# Patient Record
Sex: Female | Born: 1935 | Race: Black or African American | Hispanic: No | Marital: Married | Smoking: Former smoker
Health system: Southern US, Community
[De-identification: ages and names within clinical notes are randomized; demographics above are authoritative.]

## PROBLEM LIST (undated history)

## (undated) DIAGNOSIS — E785 Hyperlipidemia, unspecified: Secondary | ICD-10-CM

## (undated) DIAGNOSIS — I10 Essential (primary) hypertension: Secondary | ICD-10-CM

## (undated) DIAGNOSIS — H35359 Cystoid macular degeneration, unspecified eye: Secondary | ICD-10-CM

## (undated) DIAGNOSIS — I4891 Unspecified atrial fibrillation: Secondary | ICD-10-CM

---

## 2004-08-22 ENCOUNTER — Ambulatory Visit: Payer: Self-pay

## 2006-01-29 ENCOUNTER — Ambulatory Visit: Payer: Self-pay | Admitting: Internal Medicine

## 2006-04-23 ENCOUNTER — Ambulatory Visit: Payer: Self-pay | Admitting: Gastroenterology

## 2007-02-07 ENCOUNTER — Emergency Department: Payer: Self-pay | Admitting: Emergency Medicine

## 2007-02-11 ENCOUNTER — Ambulatory Visit: Payer: Self-pay | Admitting: Internal Medicine

## 2007-03-10 ENCOUNTER — Ambulatory Visit: Payer: Self-pay | Admitting: Internal Medicine

## 2007-12-16 ENCOUNTER — Ambulatory Visit: Payer: Self-pay | Admitting: Internal Medicine

## 2008-01-23 ENCOUNTER — Ambulatory Visit: Payer: Self-pay | Admitting: Family Medicine

## 2009-02-23 ENCOUNTER — Emergency Department: Payer: Self-pay | Admitting: Emergency Medicine

## 2009-04-12 ENCOUNTER — Ambulatory Visit: Payer: Self-pay | Admitting: Internal Medicine

## 2009-05-08 ENCOUNTER — Ambulatory Visit: Payer: Self-pay | Admitting: Gastroenterology

## 2009-06-07 ENCOUNTER — Ambulatory Visit: Payer: Self-pay | Admitting: Surgery

## 2009-06-14 ENCOUNTER — Inpatient Hospital Stay: Payer: Self-pay | Admitting: Surgery

## 2009-06-22 ENCOUNTER — Inpatient Hospital Stay: Payer: Self-pay | Admitting: Internal Medicine

## 2013-01-17 ENCOUNTER — Ambulatory Visit: Payer: Self-pay | Admitting: Emergency Medicine

## 2013-01-17 LAB — COMPREHENSIVE METABOLIC PANEL
Albumin: 3.3 g/dL — ABNORMAL LOW (ref 3.4–5.0)
Alkaline Phosphatase: 150 U/L — ABNORMAL HIGH (ref 50–136)
Anion Gap: 11 (ref 7–16)
BUN: 16 mg/dL (ref 7–18)
Bilirubin,Total: 0.6 mg/dL (ref 0.2–1.0)
Calcium, Total: 9.7 mg/dL (ref 8.5–10.1)
Chloride: 101 mmol/L (ref 98–107)
Co2: 25 mmol/L (ref 21–32)
Creatinine: 1.32 mg/dL — ABNORMAL HIGH (ref 0.60–1.30)
EGFR (African American): 45 — ABNORMAL LOW
EGFR (Non-African Amer.): 39 — ABNORMAL LOW
Glucose: 111 mg/dL — ABNORMAL HIGH (ref 65–99)
Osmolality: 276 (ref 275–301)
Potassium: 3.7 mmol/L (ref 3.5–5.1)
SGOT(AST): 17 U/L (ref 15–37)
SGPT (ALT): 21 U/L (ref 12–78)
Sodium: 137 mmol/L (ref 136–145)
Total Protein: 8.3 g/dL — ABNORMAL HIGH (ref 6.4–8.2)

## 2013-01-17 LAB — CBC WITH DIFFERENTIAL/PLATELET
Basophil #: 0.1 10*3/uL (ref 0.0–0.1)
Basophil %: 0.4 %
Eosinophil #: 0 10*3/uL (ref 0.0–0.7)
Eosinophil %: 0.2 %
HCT: 38.2 % (ref 35.0–47.0)
HGB: 11.9 g/dL — ABNORMAL LOW (ref 12.0–16.0)
Lymphocyte #: 1.4 10*3/uL (ref 1.0–3.6)
Lymphocyte %: 9.8 %
MCH: 25.1 pg — ABNORMAL LOW (ref 26.0–34.0)
MCHC: 31.1 g/dL — ABNORMAL LOW (ref 32.0–36.0)
MCV: 81 fL (ref 80–100)
Monocyte #: 1 x10 3/mm — ABNORMAL HIGH (ref 0.2–0.9)
Monocyte %: 6.5 %
Neutrophil #: 12.2 10*3/uL — ABNORMAL HIGH (ref 1.4–6.5)
Neutrophil %: 83.1 %
Platelet: 287 10*3/uL (ref 150–440)
RBC: 4.73 10*6/uL (ref 3.80–5.20)
RDW: 14.3 % (ref 11.5–14.5)
WBC: 14.7 10*3/uL — ABNORMAL HIGH (ref 3.6–11.0)

## 2014-07-04 ENCOUNTER — Ambulatory Visit: Payer: Self-pay | Admitting: Family Medicine

## 2014-07-04 LAB — RAPID STREP-A WITH REFLX: MICRO TEXT REPORT: NEGATIVE

## 2014-07-07 LAB — BETA STREP CULTURE(ARMC)

## 2014-09-09 ENCOUNTER — Ambulatory Visit: Payer: Self-pay | Admitting: Registered Nurse

## 2017-09-26 ENCOUNTER — Other Ambulatory Visit: Payer: Self-pay

## 2017-09-26 ENCOUNTER — Ambulatory Visit
Admission: EM | Admit: 2017-09-26 | Discharge: 2017-09-26 | Disposition: A | Discharge status: Home / Self Care | Payer: Medicare Other | Attending: Family Medicine | Admitting: Family Medicine

## 2017-09-26 DIAGNOSIS — N3000 Acute cystitis without hematuria: Secondary | ICD-10-CM | POA: Diagnosis not present

## 2017-09-26 HISTORY — DX: Unspecified atrial fibrillation: I48.91

## 2017-09-26 HISTORY — DX: Cystoid macular degeneration, unspecified eye: H35.359

## 2017-09-26 HISTORY — DX: Essential (primary) hypertension: I10

## 2017-09-26 HISTORY — DX: Hyperlipidemia, unspecified: E78.5

## 2017-09-26 LAB — URINALYSIS, COMPLETE (UACMP) WITH MICROSCOPIC
BILIRUBIN URINE: NEGATIVE
GLUCOSE, UA: NEGATIVE mg/dL
HGB URINE DIPSTICK: NEGATIVE
Leukocytes, UA: NEGATIVE
NITRITE: NEGATIVE
Protein, ur: 30 mg/dL — AB
SPECIFIC GRAVITY, URINE: 1.025 (ref 1.005–1.030)
pH: 5 (ref 5.0–8.0)

## 2017-09-26 MED ORDER — FOSFOMYCIN TROMETHAMINE 3 G PO PACK: 3.0000 g | PACK | Freq: Once | ORAL | 0 refills | Status: AC

## 2017-09-26 NOTE — ED Triage Notes (Signed)
Pt reports dysuria, abd discomfort, urinary frequency, and incontinence starting Thursday night.

## 2017-09-26 NOTE — ED Provider Notes (Signed)
[facility]    CSN: [id number] Arrival date & time: 09/26/17  1444     History   Chief Complaint Chief Complaint  Patient presents with  . Urinary Frequency    HPI Willa FraterBetty F Larkey is a 83 y.o. female.   The history is provided by the patient.  Dysuria  Pain quality:  Burning Pain severity:  Mild Onset quality:  Sudden Duration:  2 days Timing:  Constant Progression:  Worsening Chronicity:  New Recent urinary tract infections: no   Relieved by:  None tried Urinary symptoms: discolored urine and frequent urination   Associated symptoms: nausea   Associated symptoms: no abdominal pain, no fever, no flank pain, no genital lesions, no vaginal discharge and no vomiting   Risk factors: no hx of pyelonephritis, no hx of urolithiasis, no kidney transplant, not pregnant, no recurrent urinary tract infections and no renal cysts     Past Medical History:  Diagnosis Date  . Atrial fibrillation (HCC)   . Cystoid macular edema   . Hyperlipidemia   . Hypertension     There are no active problems to display for this patient.   History reviewed. No pertinent surgical history.  OB History    No data available       Home Medications    Prior to Admission medications   Medication Sig Start Date End Date Taking? Authorizing Provider  aspirin EC 81 MG tablet Take 81 mg by mouth daily.   Yes [provider]  budesonide-formoterol (SYMBICORT) 160-4.5 MCG/ACT inhaler Inhale 2 puffs into the lungs 2 (two) times daily.   Yes [provider]  furosemide (LASIX) 10 MG/ML solution Take by mouth daily.   Yes [provider]  ipratropium-albuterol (DUONEB) 0.5-2.5 (3) MG/3ML SOLN Take 3 mLs by nebulization.   Yes [provider]  loratadine (CLARITIN) 10 MG tablet Take 10 mg by mouth daily.   Yes [provider]  metoprolol succinate (TOPROL-XL) 50 MG 24 hr tablet Take 50 mg by mouth daily. Take with or immediately following a  meal.   Yes [provider]  metoprolol tartrate (LOPRESSOR) 25 MG tablet Take 25 mg by mouth 2 (two) times daily.   Yes [provider]  mirtazapine (REMERON) 15 MG tablet Take 15 mg by mouth at bedtime.   Yes [provider]  omeprazole (PRILOSEC) 20 MG capsule Take 20 mg by mouth daily.   Yes [provider]  pravastatin (PRAVACHOL) 20 MG tablet Take 20 mg by mouth daily.   Yes [provider]    Family History History reviewed. No pertinent family history.  Social History Social History   Tobacco Use  . Smoking status: Former Smoker    Types: Cigarettes  . Smokeless tobacco: Never Used  Substance Use Topics  . Alcohol use: No    Frequency: Never  . Drug use: No     Allergies   Morphine and related and Penicillins   Review of Systems Review of Systems  Constitutional: Negative for fever.  Gastrointestinal: Positive for nausea. Negative for abdominal pain and vomiting.  Genitourinary: Positive for dysuria. Negative for flank pain and vaginal discharge.     Physical Exam Triage Vital Signs ED Triage Vitals  Enc Vitals Group     BP 09/26/17 1627 (!) 174/95     Pulse Rate 09/26/17 1627 87     Resp 09/26/17 1627 (!) 22     Temp 09/26/17 1627 97.9 F (36.6 C)  Temp Source 09/26/17 1627 Oral     SpO2 09/26/17 1627 96 %     Weight 09/26/17 1625 70 lb (31.8 kg)     Height 09/26/17 1625 4\' 11"  (1.499 m)     Head Circumference --      Peak Flow --      Pain Score 09/26/17 1624 3     Pain Loc --      Pain Edu? --      Excl. in GC? --    No data found.  Updated Vital Signs BP (!) 174/95 (BP Location: Left Arm)   Pulse 87   Temp 97.9 F (36.6 C) (Oral)   Resp (!) 22   Ht 4\' 11"  (1.499 m)   Wt 70 lb (31.8 kg)   SpO2 96%   BMI 14.14 kg/m   Visual Acuity Right Eye Distance:   Left Eye Distance:   Bilateral Distance:    Right Eye Near:   Left Eye Near:    Bilateral Near:     Physical Exam  Constitutional:  She appears well-developed and well-nourished. No distress.  Abdominal: Soft. Bowel sounds are normal. She exhibits no distension and no mass. There is tenderness (mild, suprapubic). There is no rebound and no guarding.  Skin: She is not diaphoretic.  Nursing note and vitals reviewed.    UC Treatments / Results  Labs (all labs ordered are listed, but only abnormal results are displayed) Labs Reviewed  URINALYSIS, COMPLETE (UACMP) WITH MICROSCOPIC - Abnormal; Notable for the following components:      Result Value   Ketones, ur TRACE (*)    Protein, ur 30 (*)    Squamous Epithelial / LPF 0-5 (*)    Bacteria, UA RARE (*)    All other components within normal limits  URINE CULTURE    EKG  EKG Interpretation None       Radiology No results found.  Procedures Procedures (including critical care time)  Medications Ordered in UC Medications - No data to display   Initial Impression / Assessment and Plan / UC Course  I have reviewed the triage vital signs and the nursing notes.  Pertinent labs & imaging results that were available during my care of the patient were reviewed by me and considered in my medical decision making (see chart for details).       Final Clinical Impressions(s) / UC Diagnoses   Final diagnoses:  Acute cystitis without hematuria    ED Discharge Orders        Ordered    fosfomycin (MONUROL) 3 g PACK   Once     09/26/17 1709     1. Lab results and diagnosis reviewed with patient 2. rx as per orders above; reviewed possible side effects, interactions, risks and benefits  3. Recommend supportive treatment with increase water intake  4. Follow-up prn if symptoms worsen or don't improve  Controlled Substance Prescriptions [place] Controlled Substance Registry consulted? Not Applicable   Payton Mccallum, MD 09/27/17 1115

## 2017-09-27 ENCOUNTER — Telehealth: Payer: Self-pay | Admitting: *Deleted

## 2017-09-27 MED ORDER — CIPROFLOXACIN HCL 250 MG PO TABS: 250.0000 mg | ORAL_TABLET | Freq: Two times a day (BID) | ORAL | 0 refills | Status: AC

## 2017-09-27 NOTE — Telephone Encounter (Signed)
Patient called to state she did not get Fosfomycin yesterday due to being too expensive. Requesting different rx. New Rx sent.

## 2017-09-27 NOTE — Telephone Encounter (Signed)
Patient called requesting a less expensive antibiotic. Dr Judd Gaudieronty was consulted and will call in cipro for the patient. Patient informed that cipro will be available at her pharmacy for pick up.

## 2017-09-28 LAB — URINE CULTURE
Culture: NO GROWTH
Special Requests: NORMAL

## 2018-04-12 ENCOUNTER — Ambulatory Visit
Admission: EM | Admit: 2018-04-12 | Discharge: 2018-04-12 | Disposition: A | Discharge status: Home / Self Care | Payer: Medicare Other | Attending: Family Medicine | Admitting: Family Medicine

## 2018-04-12 ENCOUNTER — Ambulatory Visit (INDEPENDENT_AMBULATORY_CARE_PROVIDER_SITE_OTHER): Payer: Medicare Other

## 2018-04-12 DIAGNOSIS — R0789 Other chest pain: Secondary | ICD-10-CM | POA: Diagnosis not present

## 2018-04-12 DIAGNOSIS — S20211A Contusion of right front wall of thorax, initial encounter: Secondary | ICD-10-CM | POA: Diagnosis not present

## 2018-04-12 DIAGNOSIS — R0781 Pleurodynia: Secondary | ICD-10-CM

## 2018-04-12 DIAGNOSIS — W19XXXA Unspecified fall, initial encounter: Secondary | ICD-10-CM

## 2018-04-12 MED ORDER — ACETAMINOPHEN 500 MG PO TABS
1000.0000 mg | ORAL_TABLET | Freq: Once | ORAL | Status: AC
  Administered 2018-04-12: 1000 mg via ORAL

## 2018-04-12 NOTE — ED Triage Notes (Signed)
As per patient onset 6 days ago patient was sitting on bed and trying to go backward but was sitting on edge so had fall and shoulder hits night stand and fell on back.

## 2018-04-12 NOTE — Discharge Instructions (Signed)
Rest , ice, tylenol

## 2018-04-12 NOTE — ED Provider Notes (Signed)
[facility]    CSN: [id number] Arrival date & time: 04/12/18  1029     History   Chief Complaint Chief Complaint  Patient presents with  . Fall    HPI Krista Larsen is a 83 y.o. female.   83 yo female with a c/o right rib pain since falling 5 days ago at home. States she went to the bathroom in the middle of the night and when trying to get back in bed she fell backwards and hit her right ribs. Denies any shortness of breath.   The history is provided by the patient.  Fall     Past Medical History:  Diagnosis Date  . Atrial fibrillation (HCC)   . Cystoid macular edema   . Hyperlipidemia   . Hypertension     There are no active problems to display for this patient.   History reviewed. No pertinent surgical history.  OB History   None      Home Medications    Prior to Admission medications   Medication Sig Start Date End Date Taking? Authorizing Provider  aspirin EC 81 MG tablet Take 81 mg by mouth daily.   Yes [provider]  budesonide-formoterol (SYMBICORT) 160-4.5 MCG/ACT inhaler Inhale 2 puffs into the lungs 2 (two) times daily.   Yes [provider]  ciprofloxacin (CIPRO) 250 MG tablet Take 1 tablet (250 mg total) by mouth every 12 (twelve) hours. 09/27/17  Yes Payton Mccallum, MD  furosemide (LASIX) 10 MG/ML solution Take by mouth daily.   Yes [provider]  ipratropium-albuterol (DUONEB) 0.5-2.5 (3) MG/3ML SOLN Take 3 mLs by nebulization.   Yes [provider]  loratadine (CLARITIN) 10 MG tablet Take 10 mg by mouth daily.   Yes [provider]  metoprolol succinate (TOPROL-XL) 50 MG 24 hr tablet Take 50 mg by mouth daily. Take with or immediately following a meal.   Yes [provider]  metoprolol tartrate (LOPRESSOR) 25 MG tablet Take 25 mg by mouth 2 (two) times daily.   Yes [provider]  mirtazapine (REMERON) 15 MG tablet Take 15 mg by mouth at bedtime.   Yes [provider]  omeprazole (PRILOSEC) 20 MG capsule Take 20 mg by mouth daily.   Yes [provider]  pravastatin (PRAVACHOL) 20 MG tablet Take 20 mg by mouth daily.   Yes [provider]    Family History History reviewed. No pertinent family history.  Social History Social History   Tobacco Use  . Smoking status: Former Smoker    Types: Cigarettes  . Smokeless tobacco: Never Used  Substance Use Topics  . Alcohol use: No    Frequency: Never  . Drug use: No     Allergies   Morphine and related and Penicillins   Review of Systems Review of Systems   Physical Exam Triage Vital Signs ED Triage Vitals  Enc Vitals Group     BP 04/12/18 1044 120/79     Pulse Rate 04/12/18 1044 79     Resp 04/12/18 1044 16     Temp 04/12/18 1044 98 F (36.7 C)     Temp Source 04/12/18 1044 Oral     SpO2 04/12/18 1044 100 %     Weight 04/12/18 1041 65 lb (29.5 kg)     Height 04/12/18 1041 4\' 10"  (1.473 m)     Head Circumference --      Peak Flow --      Pain Score 04/12/18  1041 10     Pain Loc --      Pain Edu? --      Excl. in GC? --    No data found.  Updated Vital Signs BP 120/79 (BP Location: Left Arm)   Pulse 79   Temp 98 F (36.7 C) (Oral)   Resp 16   Ht 4\' 10"  (1.473 m)   Wt 29.5 kg   SpO2 100% Comment: on 3 liter of oxygen  BMI 13.59 kg/m   Visual Acuity Right Eye Distance:   Left Eye Distance:   Bilateral Distance:    Right Eye Near:   Left Eye Near:    Bilateral Near:     Physical Exam  Constitutional: She appears well-developed and well-nourished. No distress.  Cardiovascular: Normal rate, regular rhythm and normal heart sounds.  Pulmonary/Chest: Effort normal and breath sounds normal. No stridor. No respiratory distress. She has no wheezes. She has no rales. She exhibits tenderness (right lateral mid ribs).  Skin: She is not diaphoretic.  Nursing note and vitals reviewed.    UC Treatments / Results  Labs (all labs ordered are  listed, but only abnormal results are displayed) Labs Reviewed - No data to display  EKG None  Radiology Dg Ribs Unilateral W/chest Right  Result Date: 04/12/2018 CLINICAL DATA:  Fall with right-sided chest pain, initial encounter EXAM: RIGHT RIBS AND CHEST - 3+ VIEW COMPARISON:  01/17/2013 FINDINGS: Cardiac shadow is within normal limits. The lungs are hyperinflated consistent with COPD. No pneumothorax or effusion is seen. No rib fractures are identified. IMPRESSION: No evidence of acute rib fracture. COPD. Electronically Signed   By: Alcide CleverMark  Lukens M.D.   On: 04/12/2018 11:44    Procedures Procedures (including critical care time)  Medications Ordered in UC Medications  acetaminophen (TYLENOL) tablet 1,000 mg (1,000 mg Oral Given 04/12/18 1105)    Initial Impression / Assessment and Plan / UC Course  I have reviewed the triage vital signs and the nursing notes.  Pertinent labs & imaging results that were available during my care of the patient were reviewed by me and considered in my medical decision making (see chart for details).      Final Clinical Impressions(s) / UC Diagnoses   Final diagnoses:  Contusion of rib on right side, initial encounter  Fall, initial encounter     Discharge Instructions     Rest, ice, tylenol    ED Prescriptions    None     1. x-ray results (negative) and diagnosis reviewed with patient 2. Recommend supportive treatment as above 3. Follow-up prn if symptoms worsen or don't improve  Controlled Substance Prescriptions [place] Controlled Substance Registry consulted? Not Applicable   Payton Mccallumonty, Kadience Macchi, MD 04/12/18 [phone]

## 2019-12-12 IMAGING — CR DG RIBS W/ CHEST 3+V*R*
5 series · 6 of 6 positions shown · non-contrast
Comparison: 01/17/2013

CLINICAL DATA: Fall with right-sided chest pain, initial encounter

EXAM:
RIGHT RIBS AND CHEST - 3+ VIEW

[chest pa]
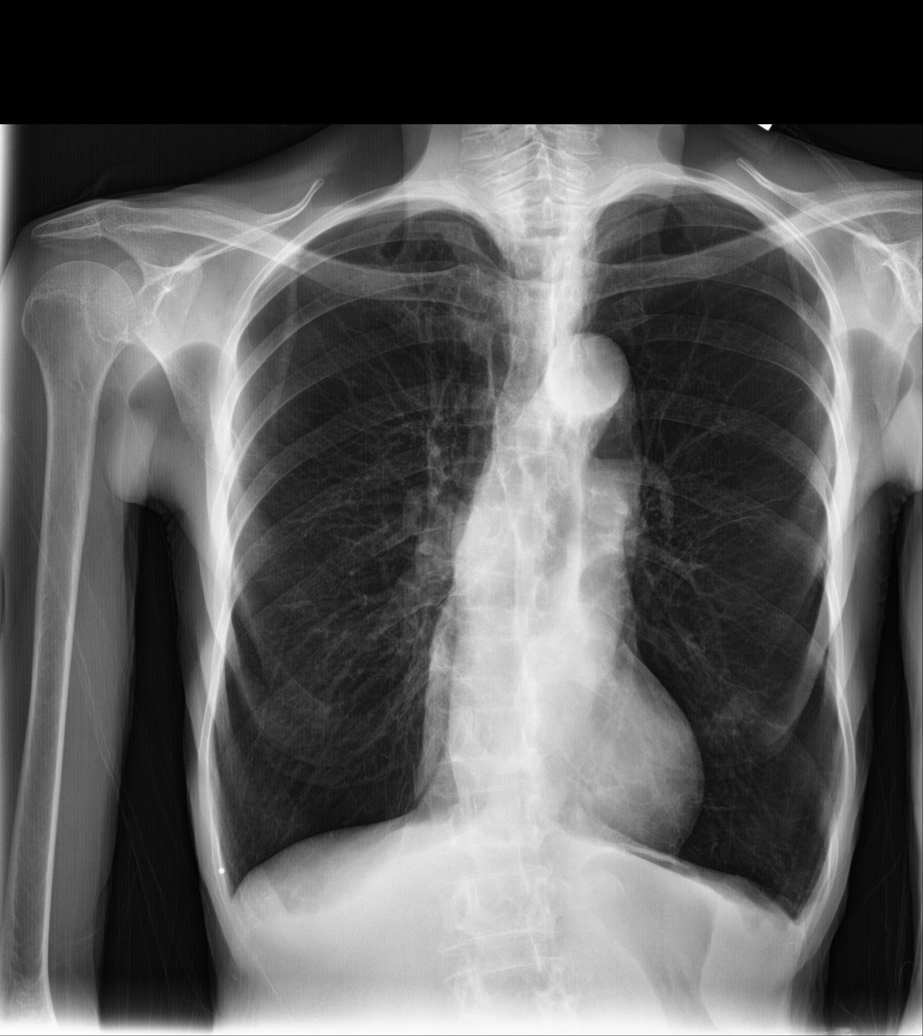

[rib pa (1 of 2)]
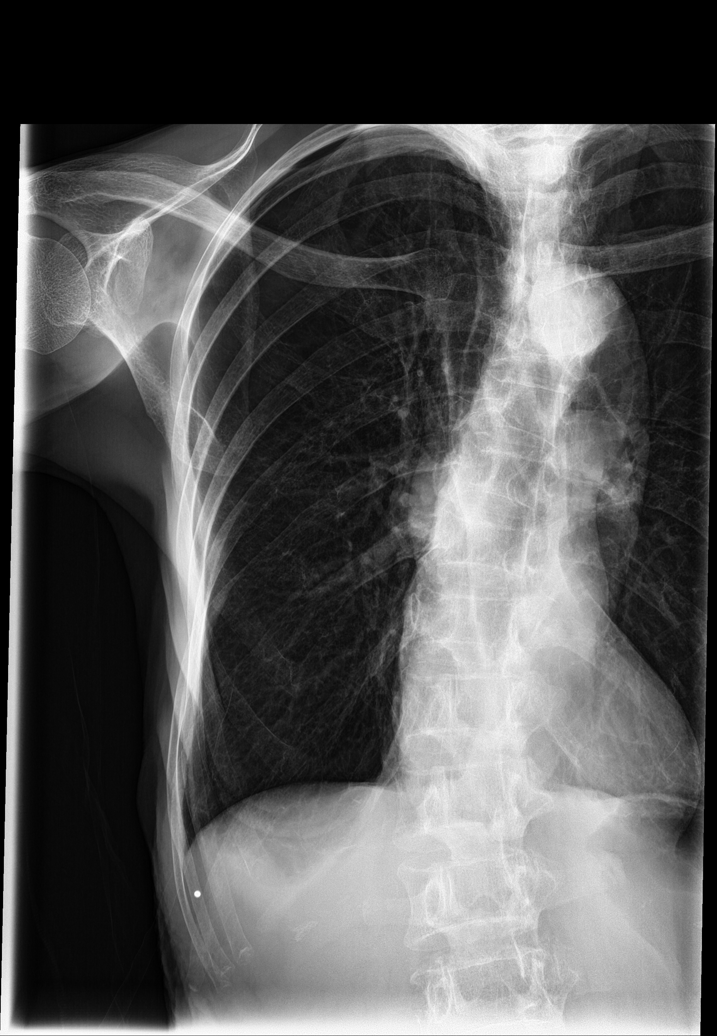

[Series 3: rib pa · 0.14mm/px · 2 of 2 slices shown (2 of 2)]
[im 1/2]
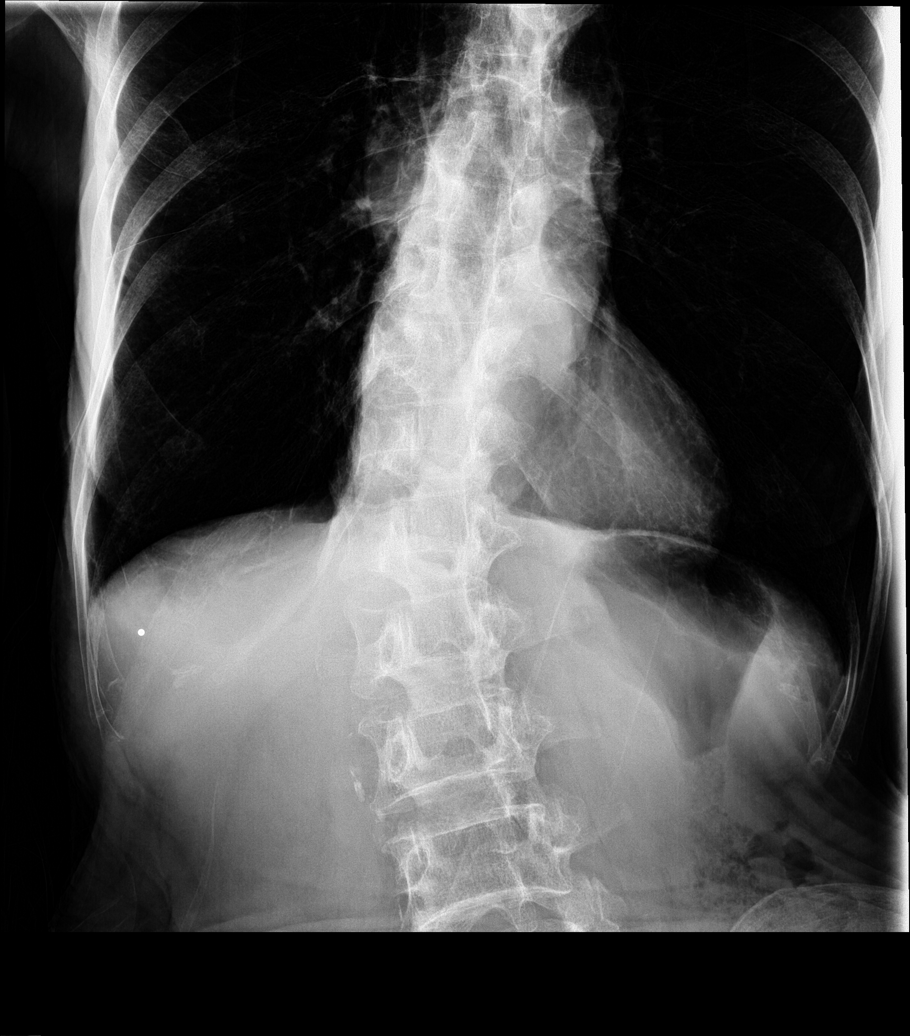
[im 2/2]
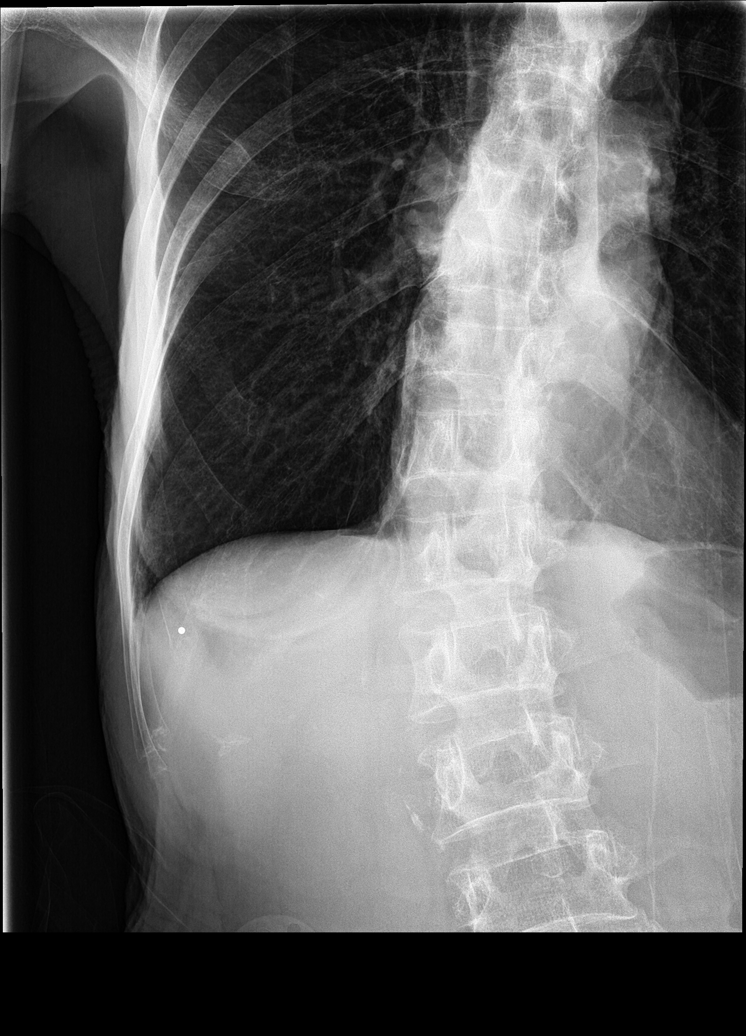

[rib obl (1 of 2)]
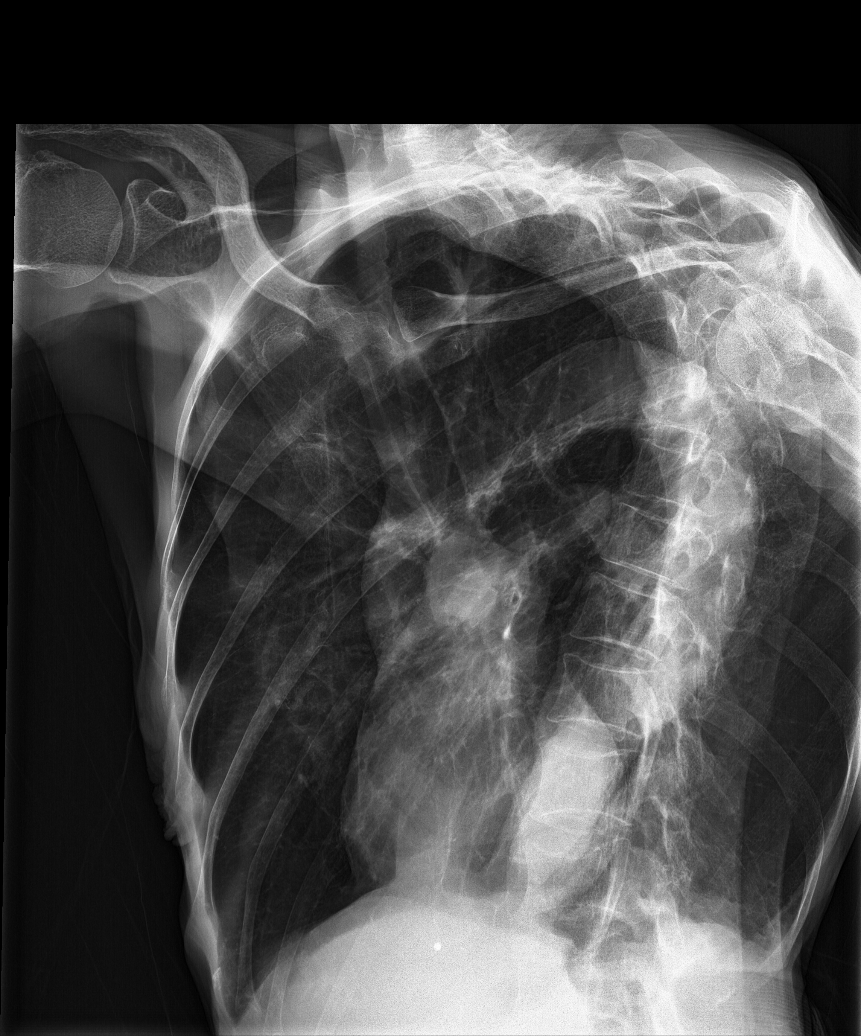

[rib obl (2 of 2)]
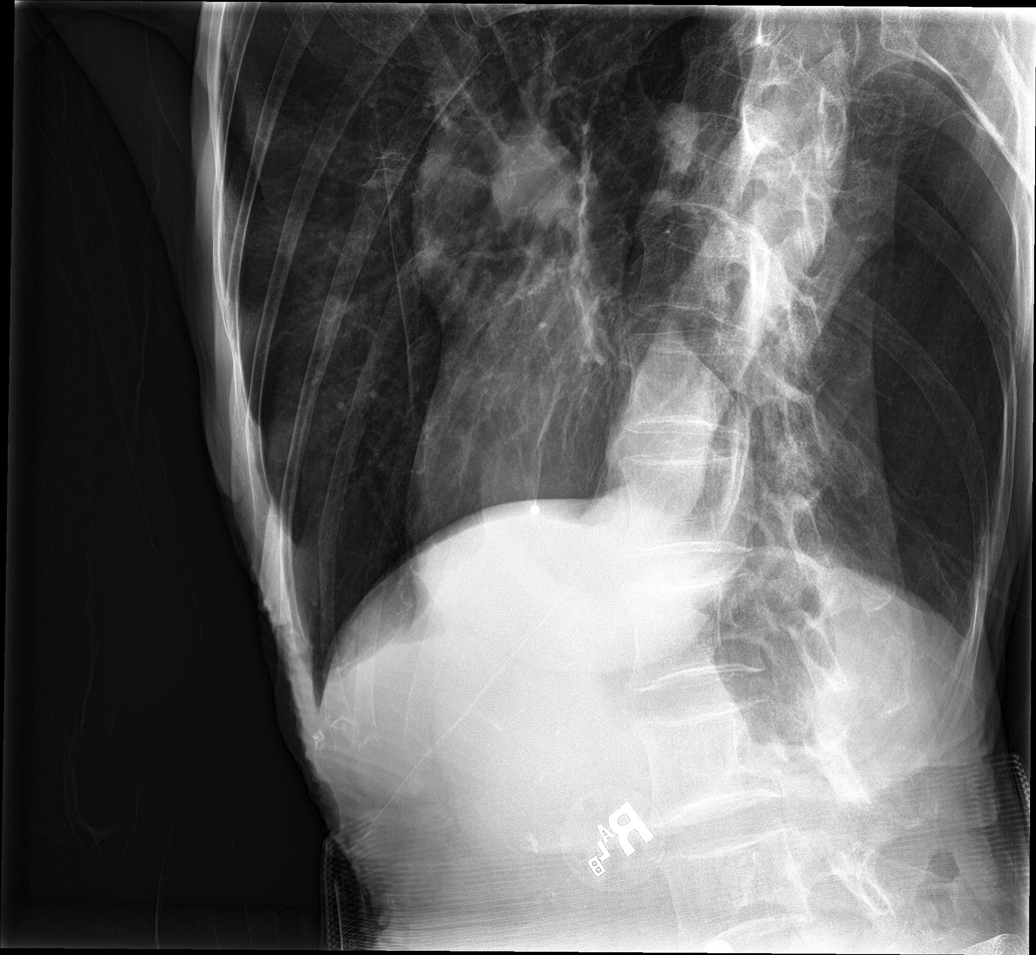

[6 of 6 positions shown; findings below may reference images not displayed]

FINDINGS: Cardiac shadow is within normal limits. The lungs are hyperinflated
consistent with COPD. No pneumothorax or effusion is seen. No rib
fractures are identified.
IMPRESSION: No evidence of acute rib fracture.

COPD.

## 2021-09-25 DEATH — deceased
